# Patient Record
Sex: Female | Born: 1975 | Hispanic: Yes | Marital: Single | Smoking: Never smoker
Health system: Southern US, Community
[De-identification: ages and names within clinical notes are randomized; demographics above are authoritative.]

## PROBLEM LIST (undated history)

## (undated) HISTORY — PX: ANKLE FRACTURE SURGERY: SHX122

---

## 2003-11-25 ENCOUNTER — Inpatient Hospital Stay (HOSPITAL_COMMUNITY): Admission: AD | Admit: 2003-11-25 | Discharge: 2003-11-25 | Payer: Self-pay | Admitting: Obstetrics and Gynecology

## 2004-01-12 ENCOUNTER — Other Ambulatory Visit: Admission: RE | Admit: 2004-01-12 | Discharge: 2004-01-12 | Payer: Self-pay | Admitting: Family Medicine

## 2004-01-12 ENCOUNTER — Encounter: Admission: RE | Admit: 2004-01-12 | Discharge: 2004-01-12 | Payer: Self-pay | Admitting: Family Medicine

## 2004-01-12 ENCOUNTER — Encounter (INDEPENDENT_AMBULATORY_CARE_PROVIDER_SITE_OTHER): Payer: Self-pay | Admitting: Specialist

## 2004-01-26 ENCOUNTER — Encounter: Admission: RE | Admit: 2004-01-26 | Discharge: 2004-01-26 | Payer: Self-pay | Admitting: Obstetrics and Gynecology

## 2004-07-26 ENCOUNTER — Ambulatory Visit: Payer: Self-pay | Admitting: Family Medicine

## 2005-07-02 ENCOUNTER — Ambulatory Visit: Payer: Self-pay | Admitting: Obstetrics & Gynecology

## 2005-07-02 ENCOUNTER — Encounter (INDEPENDENT_AMBULATORY_CARE_PROVIDER_SITE_OTHER): Payer: Self-pay | Admitting: *Deleted

## 2005-12-31 ENCOUNTER — Emergency Department (HOSPITAL_COMMUNITY): Admission: EM | Admit: 2005-12-31 | Discharge: 2005-12-31 | Payer: Self-pay | Admitting: Emergency Medicine

## 2007-02-09 ENCOUNTER — Inpatient Hospital Stay (HOSPITAL_COMMUNITY): Admission: AD | Admit: 2007-02-09 | Discharge: 2007-02-09 | Payer: Self-pay | Admitting: Obstetrics & Gynecology

## 2007-09-20 ENCOUNTER — Inpatient Hospital Stay (HOSPITAL_COMMUNITY): Admission: AD | Admit: 2007-09-20 | Discharge: 2007-09-21 | Payer: Self-pay | Admitting: Obstetrics

## 2008-01-04 ENCOUNTER — Ambulatory Visit: Payer: Self-pay | Admitting: Family Medicine

## 2008-03-02 ENCOUNTER — Ambulatory Visit: Payer: Self-pay | Admitting: Obstetrics & Gynecology

## 2008-03-07 ENCOUNTER — Encounter: Payer: Self-pay | Admitting: Internal Medicine

## 2008-03-07 ENCOUNTER — Ambulatory Visit: Payer: Self-pay | Admitting: Internal Medicine

## 2008-03-29 ENCOUNTER — Ambulatory Visit: Payer: Self-pay | Admitting: Internal Medicine

## 2009-01-24 ENCOUNTER — Ambulatory Visit: Payer: Self-pay | Admitting: Family Medicine

## 2009-04-25 ENCOUNTER — Encounter (INDEPENDENT_AMBULATORY_CARE_PROVIDER_SITE_OTHER): Payer: Self-pay | Admitting: Adult Health

## 2009-04-25 ENCOUNTER — Encounter: Payer: Self-pay | Admitting: Internal Medicine

## 2009-04-25 ENCOUNTER — Ambulatory Visit: Payer: Self-pay | Admitting: Internal Medicine

## 2009-04-25 LAB — CONVERTED CEMR LAB
ALT: 10 units/L (ref 0–35)
Basophils Absolute: 0 10*3/uL (ref 0.0–0.1)
Chlamydia, DNA Probe: NEGATIVE
Creatinine, Ser: 0.64 mg/dL (ref 0.40–1.20)
Eosinophils Absolute: 1.1 10*3/uL — ABNORMAL HIGH (ref 0.0–0.7)
Eosinophils Relative: 10 % — ABNORMAL HIGH (ref 0–5)
Hemoglobin: 12.9 g/dL (ref 12.0–15.0)
MCV: 84.4 fL (ref 78.0–100.0)
Monocytes Relative: 6 % (ref 3–12)
Platelets: 280 10*3/uL (ref 150–400)
Sodium: 138 meq/L (ref 135–145)
Total Bilirubin: 0.5 mg/dL (ref 0.3–1.2)

## 2009-05-01 ENCOUNTER — Ambulatory Visit: Payer: Self-pay | Admitting: *Deleted

## 2009-05-25 ENCOUNTER — Ambulatory Visit: Payer: Self-pay | Admitting: Internal Medicine

## 2009-09-07 ENCOUNTER — Ambulatory Visit (HOSPITAL_COMMUNITY): Admission: RE | Admit: 2009-09-07 | Discharge: 2009-09-07 | Payer: Self-pay | Admitting: Obstetrics & Gynecology

## 2010-01-09 ENCOUNTER — Ambulatory Visit: Payer: Self-pay | Admitting: Family

## 2010-01-09 ENCOUNTER — Inpatient Hospital Stay (HOSPITAL_COMMUNITY): Admission: AD | Admit: 2010-01-09 | Discharge: 2010-01-10 | Payer: Self-pay | Admitting: Obstetrics and Gynecology

## 2010-11-23 ENCOUNTER — Ambulatory Visit (INDEPENDENT_AMBULATORY_CARE_PROVIDER_SITE_OTHER): Payer: No Typology Code available for payment source

## 2010-11-23 ENCOUNTER — Observation Stay (HOSPITAL_COMMUNITY)
Admission: EM | Admit: 2010-11-23 | Discharge: 2010-11-24 | Disposition: A | Discharge status: Home / Self Care | Payer: No Typology Code available for payment source | Attending: Orthopaedic Surgery | Admitting: Orthopaedic Surgery

## 2010-11-23 ENCOUNTER — Inpatient Hospital Stay (INDEPENDENT_AMBULATORY_CARE_PROVIDER_SITE_OTHER)
Admission: RE | Admit: 2010-11-23 | Discharge: 2010-11-23 | Disposition: A | Discharge status: Home / Self Care | Payer: No Typology Code available for payment source | Source: Ambulatory Visit | Attending: Emergency Medicine | Admitting: Emergency Medicine

## 2010-11-23 DIAGNOSIS — S82899A Other fracture of unspecified lower leg, initial encounter for closed fracture: Principal | ICD-10-CM | POA: Insufficient documentation

## 2010-11-23 DIAGNOSIS — S82409A Unspecified fracture of shaft of unspecified fibula, initial encounter for closed fracture: Secondary | ICD-10-CM

## 2010-11-23 DIAGNOSIS — W19XXXA Unspecified fall, initial encounter: Secondary | ICD-10-CM | POA: Insufficient documentation

## 2010-11-23 LAB — DIFFERENTIAL
Eosinophils Absolute: 0.9 10*3/uL — ABNORMAL HIGH (ref 0.0–0.7)
Lymphocytes Relative: 31 % (ref 12–46)
Monocytes Absolute: 0.8 10*3/uL (ref 0.1–1.0)
Neutrophils Relative %: 52 % (ref 43–77)

## 2010-11-23 LAB — BASIC METABOLIC PANEL
BUN: 8 mg/dL (ref 6–23)
Calcium: 8.8 mg/dL (ref 8.4–10.5)
Creatinine, Ser: 0.58 mg/dL (ref 0.4–1.2)
Potassium: 3.9 mEq/L (ref 3.5–5.1)
Sodium: 138 mEq/L (ref 135–145)

## 2010-11-23 LAB — CBC
HCT: 36.4 % (ref 36.0–46.0)
Hemoglobin: 12.3 g/dL (ref 12.0–15.0)
MCHC: 33.8 g/dL (ref 30.0–36.0)
MCV: 82 fL (ref 78.0–100.0)
RDW: 13.1 % (ref 11.5–15.5)

## 2010-11-24 LAB — URINALYSIS, ROUTINE W REFLEX MICROSCOPIC
Glucose, UA: NEGATIVE mg/dL
Nitrite: NEGATIVE
Specific Gravity, Urine: 1.01 (ref 1.005–1.030)
pH: 7 (ref 5.0–8.0)

## 2010-11-24 LAB — URINE MICROSCOPIC-ADD ON

## 2010-11-29 NOTE — Op Note (Signed)
  NAME:  Maria Welch, Maria Welch   ACCOUNT NO.:  [id number]  MEDICAL RECORD NO.:  [id number]           PATIENT TYPE:  LOCATION:                                 FACILITY:  PHYSICIAN:  Takako Minckler C. Ophelia Charter, M.D.    DATE OF BIRTH:  1976-08-13  DATE OF PROCEDURE: DATE OF DISCHARGE:                              OPERATIVE REPORT   PREOPERATIVE DIAGNOSIS:  Left closed lateral malleolar fracture with ankle displacement.  POSTOPERATIVE DIAGNOSIS:  Left closed lateral malleolar fracture with ankle displacement.  PROCEDURE:  ORIF left lateral malleolus.  SURGEON:  Dale Ribeiro C. Ophelia Charter, M.D.  ANESTHESIA:  General plus 10 mL Marcaine local.  TOURNIQUET TIME:  19 minutes.  COMPONENTS:  Synthes nonlocking one-third tubular plate with the inner frag screw.  COMPLICATIONS:  None.  PROCEDURE:  After induction of general anesthesia, informed consent, DuraPrep, preoperative Ancef prophylaxis, prepping and draping with DuraPrep with the proximal thigh tourniquet, usual extremity sheets, drapes, stockinette was performed.  Time-out procedure completed.  A leg wrapped with an Esmarch tourniquet inflated.  Lateral skin marker was used.  Incision was made laterally.  Subperiosteal dissection over the fibula, fracture was distracted and irrigated.  Clot was removed, reduced, and held with Verbrugge clamp.  A 6-0 one-third tubular plate was selected, distal screws were cancellous unicortical 12 mm.  Second from the bottom screw did not bite well and at the end of the case it was spinning, did not give good purchase and was in the position where the inner frag screw lagged from anterior proximal to distal posterior. Proximally, three screws were bicortical pin of 12 mm lengths.  Lag screw was inserted.  The fracture was tightened down second screw from the bottom of the plate, which is at the fracture site was not tight, it was removed.  Trial with cancellus was attempted but it did not bite either, so the  screw hole was left open.  If it was made be, bicortical would have been into the syndesmotic joint.  After irrigation saline solution, tourniquet deflated.  Spot fluoro pictures were taken showing anatomic position.  A 2-0 Vicryl and subcutaneous tissue, skin staple closure, postop dressing, short-leg splint with Xeroform, 4x4s, Webril, 5 x 30 sponge x3, more Webril, and 6-inch Ace wrap.  The patient was extubated, transferred to recovery room.     Cass Edinger C. Ophelia Charter, M.D.   ______________________________ Veverly Fells. Ophelia Charter, M.D.    MCY/MEDQ  D:  11/23/2010  T:  11/24/2010  Job:  [id number]  Electronically Signed by Annell Greening M.D. on 11/27/2010 09:03:19 PM

## 2010-12-11 LAB — CBC
HCT: 40.3 % (ref 36.0–46.0)
Hemoglobin: 13.3 g/dL (ref 12.0–15.0)
MCHC: 33 g/dL (ref 30.0–36.0)
MCV: 88.1 fL (ref 78.0–100.0)
Platelets: 181 10*3/uL (ref 150–400)
RBC: 4.57 MIL/uL (ref 3.87–5.11)
RDW: 13.7 % (ref 11.5–15.5)

## 2010-12-11 LAB — RPR: RPR Ser Ql: NONREACTIVE

## 2011-02-08 NOTE — Group Therapy Note (Signed)
NAME:  Maria Welch, Maria Welch    ACCOUNT NO.:  [id number]   MEDICAL RECORD NO.:  [id number]          PATIENT TYPE:  WOC   LOCATION:  [facility]                   FACILITY:  [facility]   PHYSICIAN:  Reda Gettis                DATE OF BIRTH:  [birth year]   DATE OF SERVICE:  07/02/2005                                    CLINIC NOTE   CHIEF COMPLAINT:  Yearly check-up and IUD string check.   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  Patient came today  for one-year repeat Pap smear.  Patient is asymptomatic.   PAST MEDICAL HISTORY:  Patient is healthy otherwise.   OBJECTIVE:  VITAL SIGNS:  Reviewed, within normal limits.  GENERAL:  No acute distress.  CARDIOVASCULAR:  RRR.  No murmurs, rubs, or gallops.  CHEST:  Clear to auscultation bilaterally.  ABDOMEN:  Nontender, nondistended, within normal limits.  BREASTS:  Within normal limits.  GENITOURINARY:  Speculum examination:  There is a bloody discharge in the  cervix.  __________  visualized.  __________  cervix __________  no motion  tenderness, a small uterus, no adnexal masses.   Pap smear, GC/Chlamydia samples taken.   ASSESSMENT/PLAN:  A 35 year old healthy female.  Patient to return to clinic  one year or sooner if abnormal Pap smear.  Patient will be notified.     ______________________________  Mckinnley Cottier    ______________________________  [id number]    Hadley Pen  D:  07/02/2005  T:  07/03/2005  Job:  [id number]

## 2011-06-28 LAB — CBC
MCHC: 33.6
MCV: 78
Platelets: 201
RDW: 13.8
RDW: 14.2
WBC: 11.7 — ABNORMAL HIGH
WBC: 14.6 — ABNORMAL HIGH

## 2011-12-23 ENCOUNTER — Ambulatory Visit: Payer: Self-pay | Admitting: Internal Medicine

## 2011-12-23 VITALS — BP 127/85 | HR 102 | Temp 99.9°F | Resp 16 | Ht 67.75 in | Wt 143.0 lb

## 2011-12-23 DIAGNOSIS — J020 Streptococcal pharyngitis: Secondary | ICD-10-CM

## 2011-12-23 DIAGNOSIS — J039 Acute tonsillitis, unspecified: Secondary | ICD-10-CM

## 2011-12-23 LAB — POCT RAPID STREP A (OFFICE): Rapid Strep A Screen: POSITIVE — AB

## 2011-12-23 MED ORDER — AMOXICILLIN 875 MG PO TABS: 875.0000 mg | ORAL_TABLET | Freq: Two times a day (BID) | ORAL | Status: AC

## 2011-12-23 MED ORDER — MAGIC MOUTHWASH W/LIDOCAINE: 5.0000 mL | Freq: Four times a day (QID) | ORAL | Status: AC | PRN

## 2011-12-23 NOTE — Progress Notes (Signed)
  Subjective:    Patient ID: Maria Welch, female    DOB: June 20, 1976, 36 y.o.   MRN: [id number]  Sore Throat  This is a new problem. The current episode started in the past 7 days. The problem has been gradually worsening. The maximum temperature recorded prior to her arrival was 100 - 100.9 F. The pain is moderate. Associated symptoms include congestion, swollen glands and trouble swallowing. Pertinent negatives include no coughing or headaches.  Rudell Cobb is a 36 year old Spanish speaking lady here with her 59 year old daughter who translates for her.  She works in a [employer] and her boss has been ill recently.  She complains of fever, chills, myalgias, nausea and sore throat.  She denies pregnancy, vomiting, rashes, no cough or chest pain.    Review of Systems  HENT: Positive for congestion and trouble swallowing.   Respiratory: Negative for cough.   Gastrointestinal: Positive for nausea.  Skin: Negative.   Neurological: Negative for headaches.  All other systems reviewed and are negative.       Objective:   Physical Exam  Vitals reviewed. Constitutional: She is oriented to person, place, and time. She appears well-nourished.       Pt appears mildly distressed.  HENT:  Head: Normocephalic.       Tm's injected bilaterally. Large cryptic tonsils with white exudate bilaterally Tender left upper cervical nodes.  Eyes: Conjunctivae are normal.  Neck: Neck supple.  Cardiovascular: Regular rhythm and normal heart sounds.        Pulse 104  Pulmonary/Chest: Effort normal and breath sounds normal.  Abdominal: Soft.  Musculoskeletal: Normal range of motion.  Lymphadenopathy:    She has cervical adenopathy.  Neurological: She is alert and oriented to person, place, and time.  Skin: Skin is warm and dry.  Psychiatric: Her behavior is normal.          Assessment & Plan:  RS positive.  Amoxicillin 875 BID for 10 days.  Magic Mouthwash and Ibuprophen 800 mg every 8  hours for pain for a few days with food.  AVS printed and given in Spanish.  OOW until Wed April 3rd note written.

## 2011-12-23 NOTE — Patient Instructions (Signed)
Angina por estreptococos (Strep Throat)  La angina estreptocccica es una infeccin en la garganta causada por una bacteria llamada Streptococcus pyogenes. El mdico la llamar "amigdalitis" o "faringitis" estreptocccica, segn haya signos de inflamacin en las amgdalas o en la zona posterior de la garganta. Es ms frecuente en nios entre los 5 y los 15 aos durante los meses de fro, Biomedical engineer puede ocurrir a [employer]er [employer]. La infeccin se transmite de persona a persona (contagio) a travs de la tos, el estornudo u otro contacto cercano.  SNTOMAS  Fiebre o escalofros.   La garganta o las [employer] duelen y estn inflamadas.   Dolor o dificultad para tragar.   Puntos blancos o amarillos en las amgdalas o la garganta.   Ganglios linfticos hinchados a los lados del cuello o debajo de la [place].   Erupcin rojiza en todo el cuerpo (poco comn).  DIAGNSTICO Diferentes infecciones pueden causar los mismos sntomas. Para confirmar el diagnstico deber [employer], y as podrn prescribirle el tratamiento adecuado. La "prueba rpida de estreptococo" ayudar al mdico a hacer el diagnstico en algunos minutos. Si no se dispone de la prueba, se har un rpido hisopado de la zona afectada para hacer un cultivo de las secreciones de la garganta. Si se hace un cultivo, los resultados estarn disponibles en [employer].  TRATAMIENTO El estreptococo de garganta se trata con antibiticos. INSTRUCCIONES PARA EL CUIDADO DOMICILIARIO  Haga grgaras con 1 cucharadita de sal en 1 taza de agua tibia, 3  4 veces por da o cuando lo crea necesario para sentirse mejor.   Los miembros de la familia que tambin tengan dolor en la garganta deben ser evaluados y tratados con antibiticos si tienen la infeccin.   Asegrese de que todas las personas de su casa se laven [employer].   No comparta alimentos, tazas ni utensilios personales que puedan contagiar la infeccin.   Coma  alimentos blandos hasta que el dolor de garganta mejore.   Beba gran cantidad de lquido para mantener la orina de tono claro o color amarillo plido. Esto ayudar a Agricultural engineer.   Debe hacer reposo.   No concurra a la escuela o la trabajo hasta que haya tomado los antibiticos durante 24 horas.   Utilice los medicamentos de venta libre o de prescripcin para Chief Technology Officer, Environmental health practitioner o la [place], segn se lo indique el profesional que lo asiste.   Si le han recetado medicamentos, tmelos segn las indicaciones. Finalice la prescripcin completa, aunque se sienta mejor.  SOLICITE ATENCIN MDICA SI:  Los ganglios del cuello siguen agrandados.   Aparece una erupcin cutnea, tos o dolor de odos.   Tiene un catarro verde, amarillo amarronado o esputo sanguinolento.   Siente dolor o Dentist que no puede controlar con los medicamentos.   Los sntomas parecen empeorar en vez de mejorar.  SOLICITE ATENCIN MDICA DE INMEDIATO SI:  Presenta algn nuevo sntoma, como vmitos, dolor de cabeza intenso, rigidez o dolor en el cuello, dolor en el pecho o problemas respiratorios o dificultad para tragar.   Presenta dolor de garganta intenso, babeo o cambios en la voz.   Siente que el cuello se hincha o la piel de esa zona se vuelve roja y sensible.   Tiene fiebre.   Tiene signos de deshidratacin, como fatiga, boca seca y disminucin de la [place].   Comienza a sentir mucho sueo, o no puede despertarse bien.  Document Released: 06/19/2005 Document Revised: 08/29/2011 [employer]  Patient Information 2012 [employer]. 

## 2012-02-26 IMAGING — CR DG ANKLE COMPLETE 3+V*L*
3 series · 3 of 3 positions shown · non-contrast
Comparison: None

CLINICAL DATA: Fall, ankle injury.

LEFT ANKLE COMPLETE - 3+ VIEW

[view not recorded (1 of 3)]
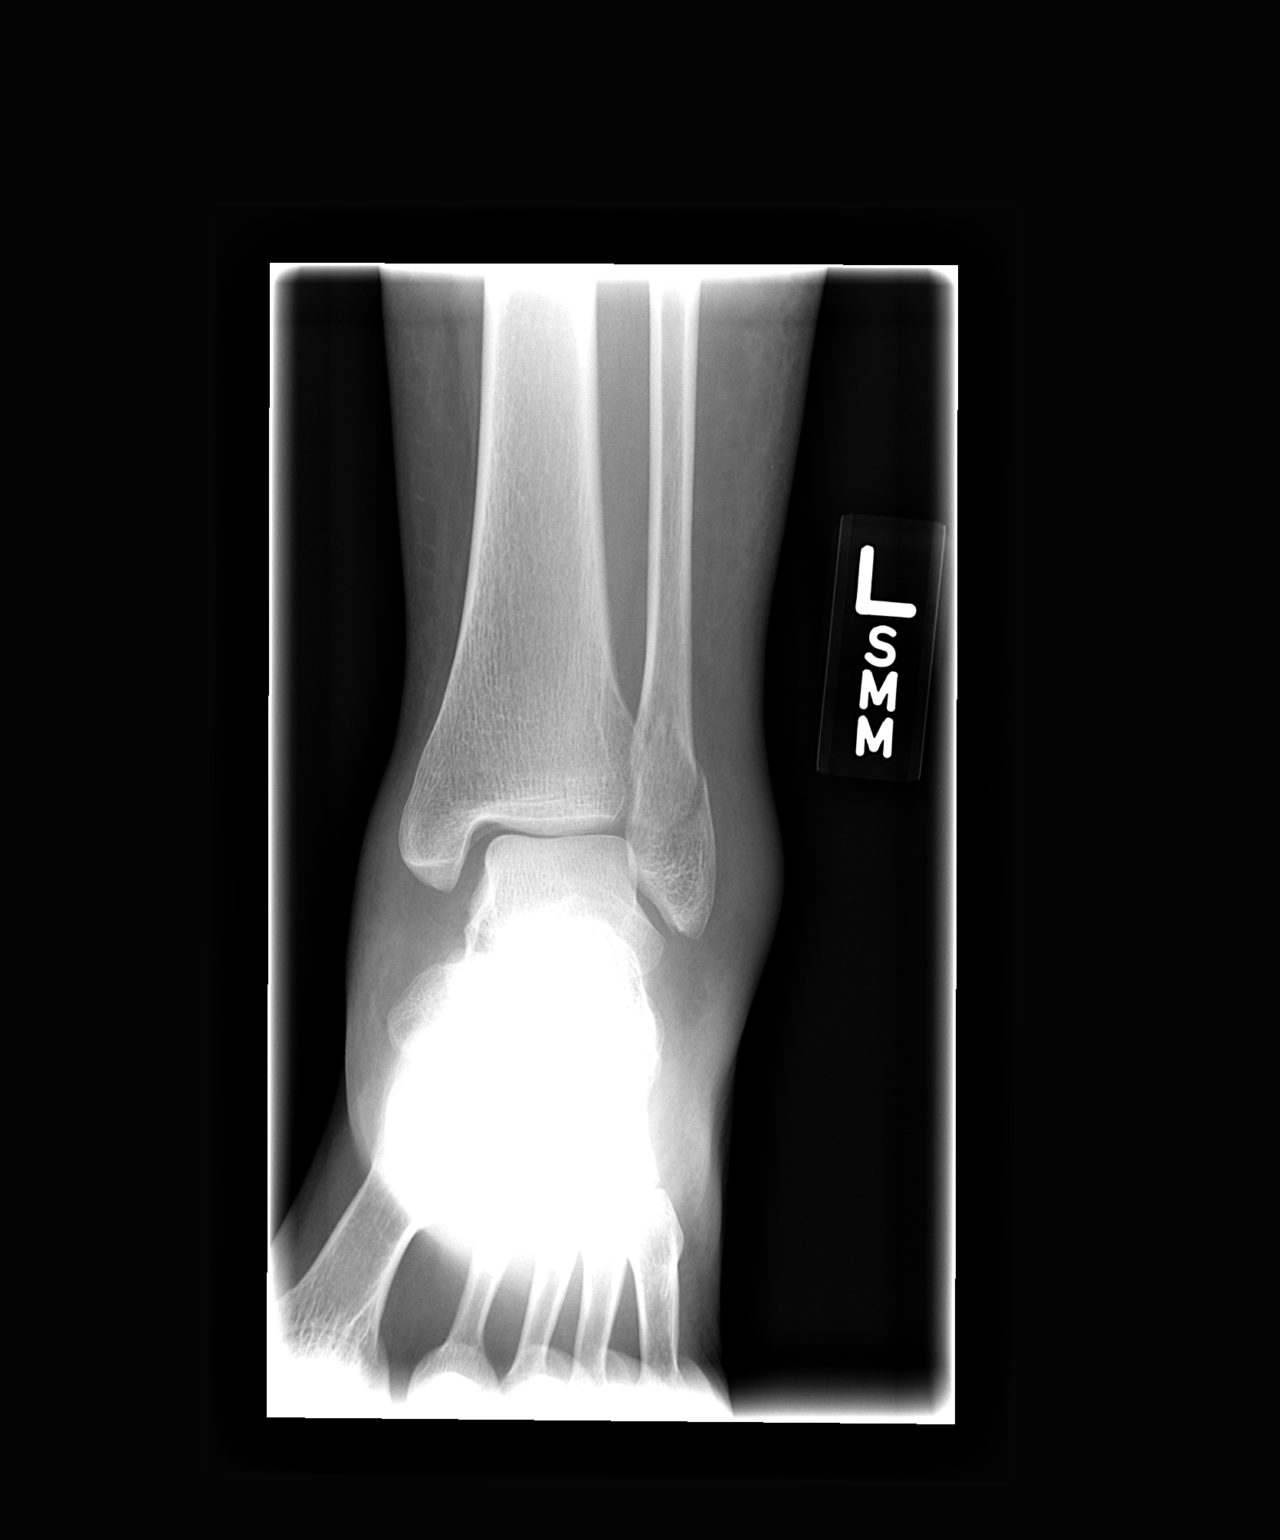

[view not recorded (2 of 3)]
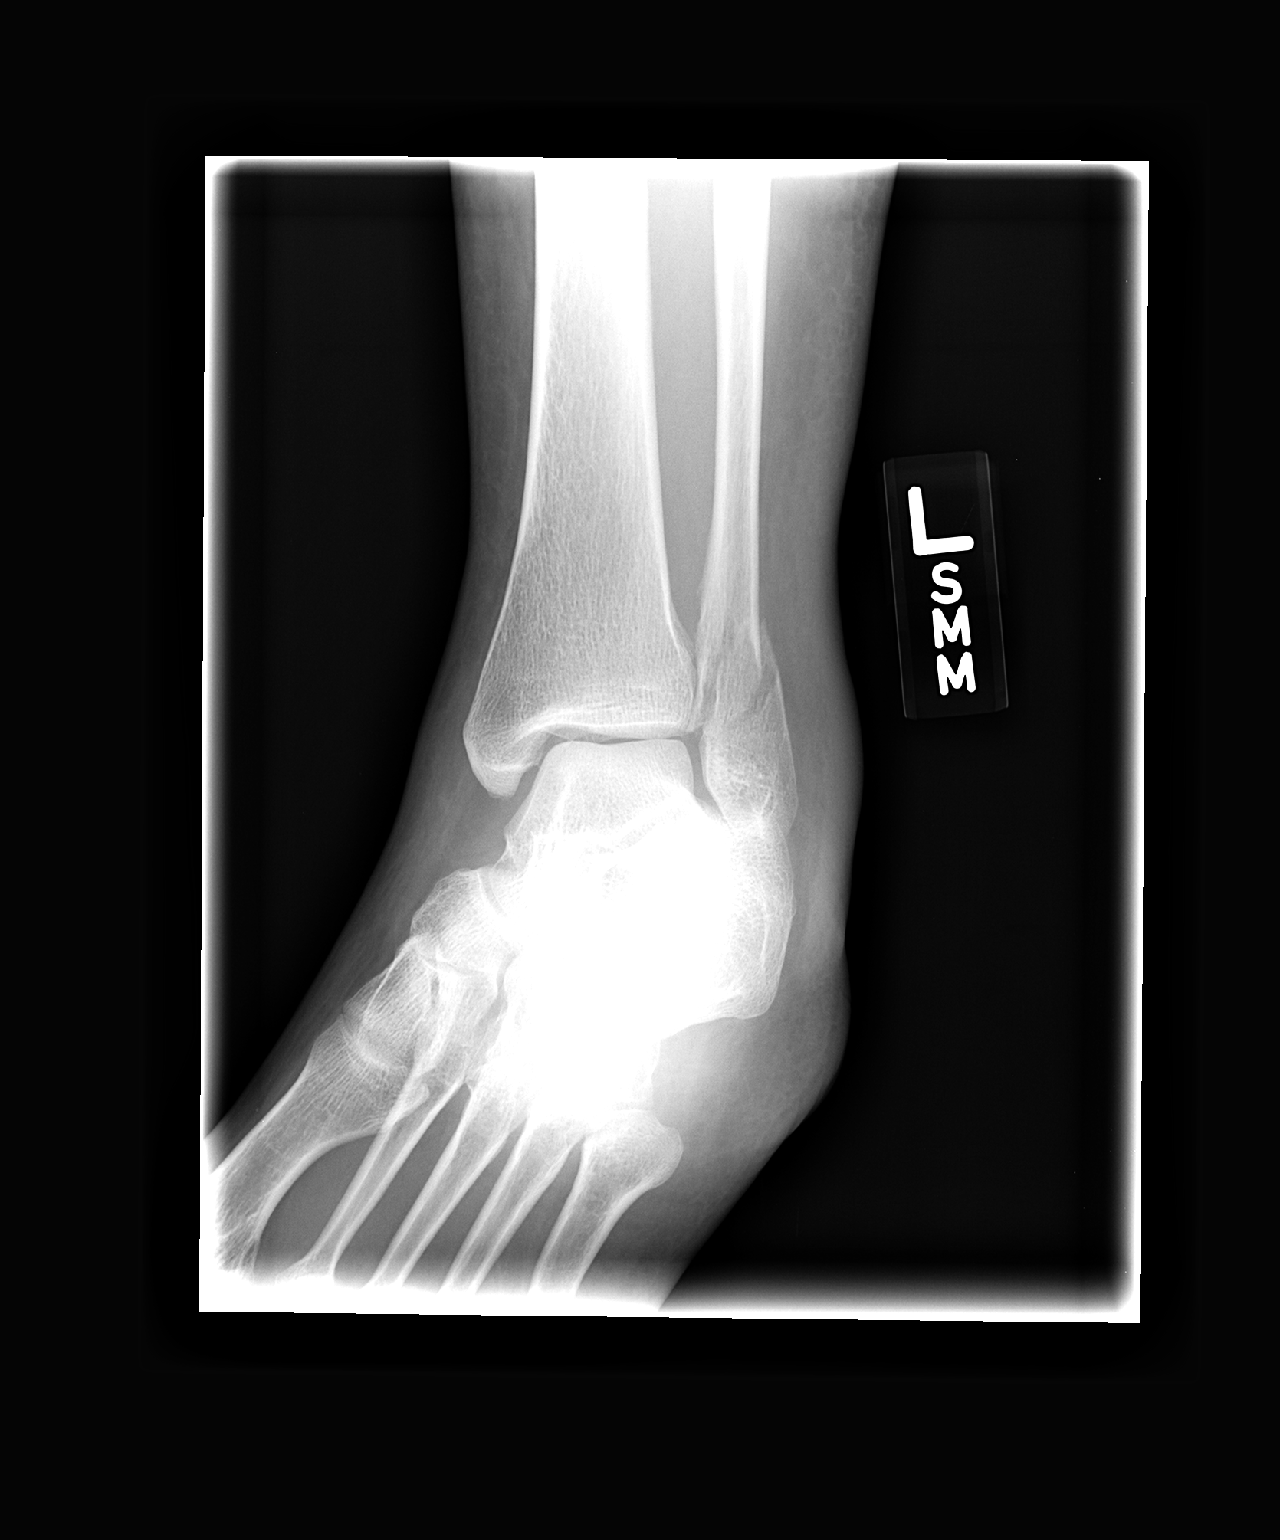

[view not recorded (3 of 3)]
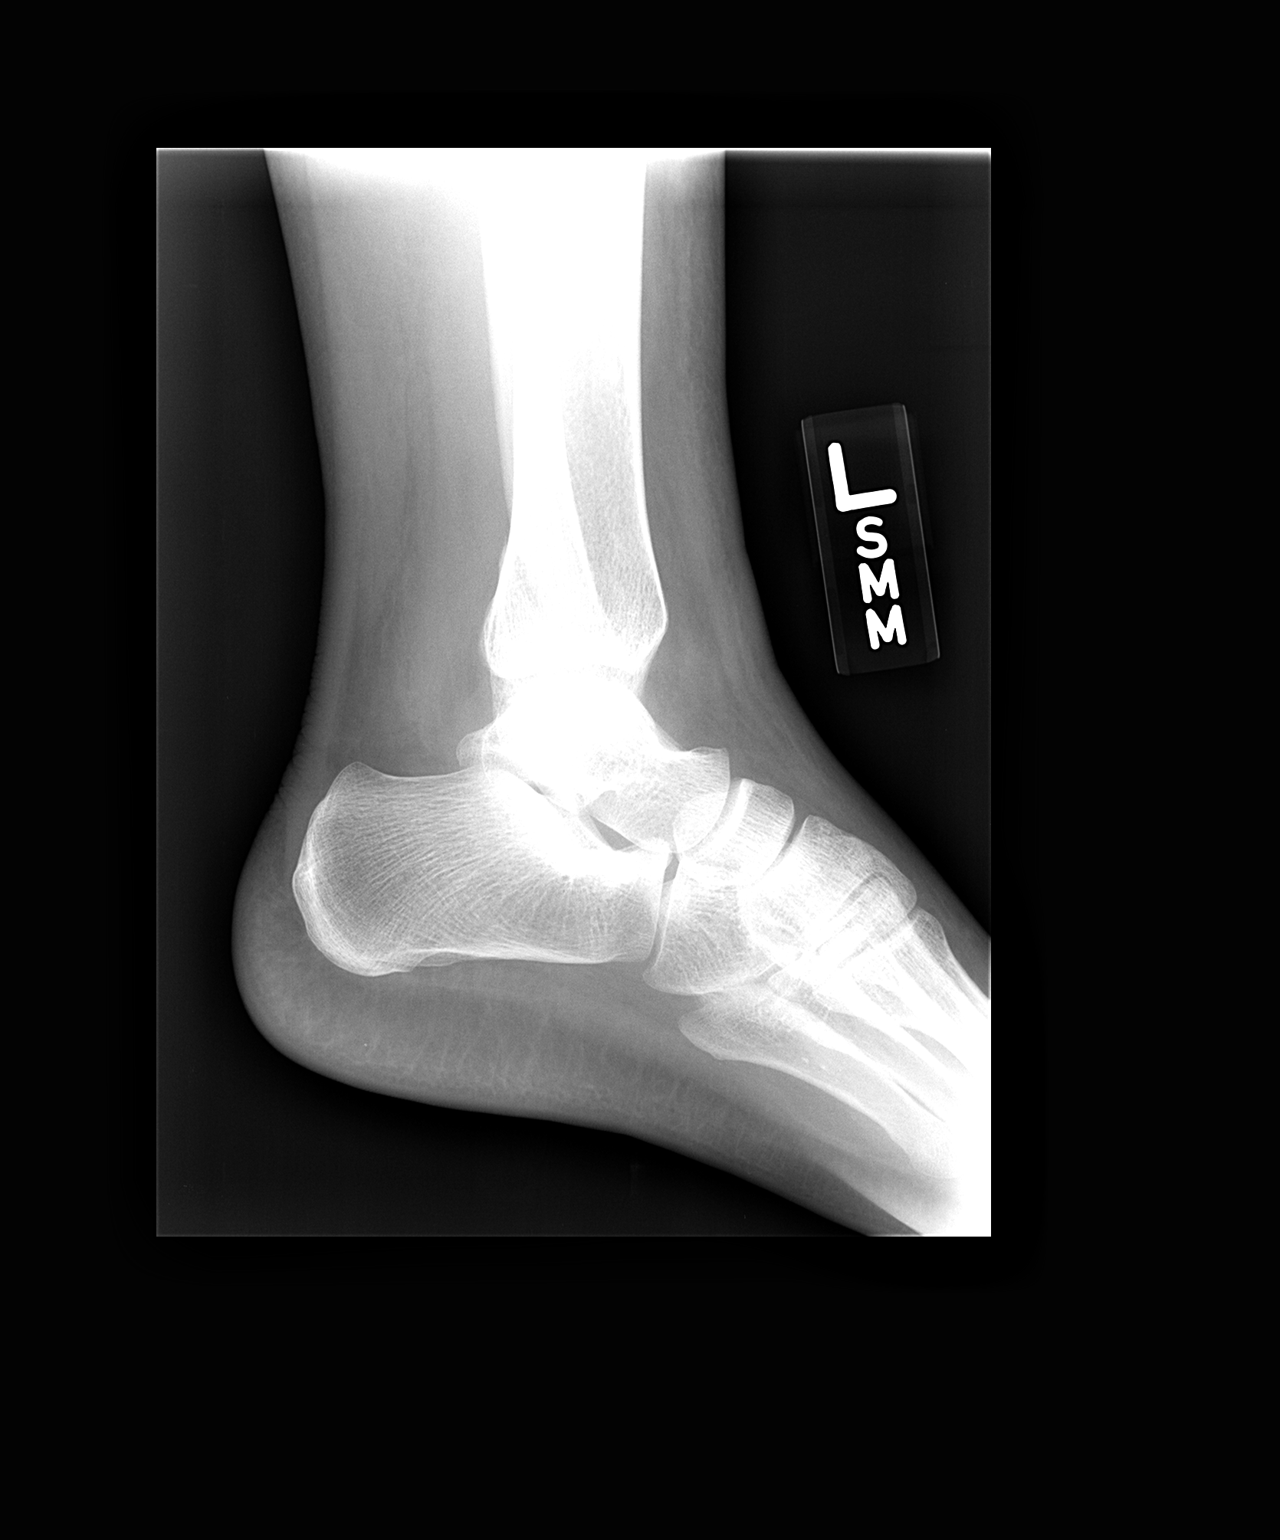

[3 of 3 positions shown; findings below may reference images not displayed]

FINDINGS: There is an oblique fracture through the distal left
fibula.  No tibial abnormality visualized.  Overlying lateral soft
tissue swelling.
IMPRESSION: Oblique fracture through the distal left fibula.

## 2012-10-22 ENCOUNTER — Emergency Department (HOSPITAL_COMMUNITY)
Admission: EM | Admit: 2012-10-22 | Discharge: 2012-10-22 | Disposition: A | Discharge status: Home / Self Care | Payer: No Typology Code available for payment source | Source: Home / Self Care

## 2012-10-22 MED ORDER — INFLUENZA VIRUS VACC SPLIT PF IM SUSP
0.5000 mL | Freq: Once | INTRAMUSCULAR | Status: AC
  Administered 2012-10-22: 0.5 mL via INTRAMUSCULAR

## 2012-10-22 NOTE — ED Notes (Signed)
Per Dr. Cranford Mon to give flu vaccine.

## 2012-10-29 ENCOUNTER — Emergency Department (HOSPITAL_COMMUNITY)
Admission: EM | Admit: 2012-10-29 | Discharge: 2012-10-29 | Disposition: A | Discharge status: Home / Self Care | Payer: No Typology Code available for payment source | Source: Home / Self Care

## 2012-10-29 ENCOUNTER — Encounter (HOSPITAL_COMMUNITY): Payer: Self-pay

## 2012-10-29 DIAGNOSIS — K029 Dental caries, unspecified: Secondary | ICD-10-CM

## 2012-10-29 NOTE — ED Provider Notes (Signed)
History     CSN: [id number]  Arrival date & time 10/29/12  1644   None     No chief complaint on file.   (Consider location/radiation/quality/duration/timing/severity/associated sxs/prior treatment) HPI Pt presenting today for dental referral.  Pt says that she has been in need for a dental exam and treatment.  No other complaints.   History reviewed. No pertinent past medical history.  History reviewed. No pertinent past surgical history.  No family history on file.  History  Substance Use Topics  . Smoking status: Never Smoker   . Smokeless tobacco: Not on file  . Alcohol Use: Not on file    OB History    Grav Para Term Preterm Abortions TAB SAB Ect Mult Living                  Review of Systems  Constitutional: Negative.   HENT: Negative.   Respiratory: Negative.   Cardiovascular: Negative.   Musculoskeletal: Negative.   Neurological: Negative.   Hematological: Negative.   Psychiatric/Behavioral: Negative.     Allergies  Review of patient's allergies indicates no known allergies.  Home Medications   Current Outpatient Rx  Name  Route  Sig  Dispense  Refill  . MAGIC MOUTHWASH W/LIDOCAINE   Oral   Take 5 mLs by mouth 4 (four) times daily as needed.   120 mL   0     Temp 97.7 F (36.5 C) (Oral)  Physical Exam  Nursing note and vitals reviewed. Constitutional: She is oriented to person, place, and time. She appears well-developed and well-nourished. No distress.  HENT:  Head: Normocephalic and atraumatic.  Right Ear: External ear normal.  Left Ear: External ear normal.       gingivitis  Eyes: EOM are normal. Pupils are equal, round, and reactive to light.  Neck: Normal range of motion.  Cardiovascular: Normal rate, regular rhythm and normal heart sounds.   Pulmonary/Chest: Effort normal.  Abdominal: Soft. Bowel sounds are normal.  Musculoskeletal: Normal range of motion.  Neurological: She is alert and oriented to person, place, and time.   Skin: Skin is warm and dry.  Psychiatric: She has a normal mood and affect. Her behavior is normal. Judgment and thought content normal.    ED Course  Procedures (including critical care time)  Labs Reviewed - No data to display No results found.   No diagnosis found.    MDM  IMPRESSION  gingivitis  RECOMMENDATIONS / PLAN Dental referral     FOLLOW UP As needed for regular medical care   The patient was given clear instructions to go to ER or return to medical center if symptoms don't improve, worsen or new problems develop.  The patient verbalized understanding.  The patient was told to call to get lab results if they haven't heard anything in the next week.            Cleora Fleet, MD 10/30/12 [phone]

## 2012-10-29 NOTE — ED Notes (Signed)
Here for referral to dentist

## 2015-04-19 ENCOUNTER — Ambulatory Visit (INDEPENDENT_AMBULATORY_CARE_PROVIDER_SITE_OTHER): Payer: Self-pay | Admitting: Family Medicine

## 2015-04-19 VITALS — BP 104/72 | HR 76 | Temp 98.6°F | Resp 18 | Ht 62.0 in | Wt 162.0 lb

## 2015-04-19 DIAGNOSIS — R1032 Left lower quadrant pain: Secondary | ICD-10-CM

## 2015-04-19 DIAGNOSIS — M545 Low back pain, unspecified: Secondary | ICD-10-CM

## 2015-04-19 DIAGNOSIS — Z124 Encounter for screening for malignant neoplasm of cervix: Secondary | ICD-10-CM

## 2015-04-19 DIAGNOSIS — B3731 Acute candidiasis of vulva and vagina: Secondary | ICD-10-CM

## 2015-04-19 DIAGNOSIS — B373 Candidiasis of vulva and vagina: Secondary | ICD-10-CM

## 2015-04-19 DIAGNOSIS — R1031 Right lower quadrant pain: Secondary | ICD-10-CM

## 2015-04-19 DIAGNOSIS — Z789 Other specified health status: Secondary | ICD-10-CM

## 2015-04-19 LAB — POCT URINE PREGNANCY: PREG TEST UR: NEGATIVE

## 2015-04-19 LAB — POCT URINALYSIS DIPSTICK
Bilirubin, UA: NEGATIVE
Blood, UA: NEGATIVE
GLUCOSE UA: NEGATIVE
NITRITE UA: NEGATIVE
PH UA: 7
PROTEIN UA: NEGATIVE
Spec Grav, UA: 1.02
UROBILINOGEN UA: 1

## 2015-04-19 LAB — POCT CBC
Granulocyte percent: 62.6 %G (ref 37–80)
HEMATOCRIT: 38.3 % (ref 37.7–47.9)
Hemoglobin: 12.7 g/dL (ref 12.2–16.2)
LYMPH, POC: 3 (ref 0.6–3.4)
MCH: 27.3 pg (ref 27–31.2)
MCHC: 33.2 g/dL (ref 31.8–35.4)
MCV: 82.4 fL (ref 80–97)
MID (CBC): 0.9 (ref 0–0.9)
MPV: 7.4 fL (ref 0–99.8)
PLATELET COUNT, POC: 305 10*3/uL (ref 142–424)
POC GRANULOCYTE: 6.5 (ref 2–6.9)
POC LYMPH PERCENT: 28.7 %L (ref 10–50)
POC MID %: 8.7 %M (ref 0–12)
RBC: 4.65 M/uL (ref 4.04–5.48)
RDW, POC: 13.9 %
WBC: 10.4 10*3/uL — AB (ref 4.6–10.2)

## 2015-04-19 MED ORDER — CYCLOBENZAPRINE HCL 10 MG PO TABS: 10.0000 mg | ORAL_TABLET | Freq: Two times a day (BID) | ORAL | Status: AC | PRN

## 2015-04-19 NOTE — Progress Notes (Addendum)
[facility] [address] [place] [address] [phone]  Date:  04/19/2015   Name:  Maria Welch   DOB:  [birth year]   MRN:  [id number]  PCP:  No primary care provider on file.    Chief Complaint: pain in lower back; pain in stomach; pain in left leg; and female wellness check   History of Present Illness:  Maria Welch is a 39 y.o. very pleasant female patient who presents with the following:  Last visit here over 3 years ago so she is seen as a new patient today.   She has noted abd pain for approx one month.  She notes that when she lifting something heavy or bearing down, or when using her stomach muscles (such as when sitting up in bed) she has pain in her lower abd.  It is often all across her lower belly, or may be one side of the other.  It is not there every day.  She got more concerned when she had a more severe pain in the RLQ yesterday evening, it lasted about 20 minutes.  This prompted her to come in today.   She will notice pain when she needs to have a BM.  However her stools are normal, she is not constipated.  She does feel better after having a BM She is eating normally, no weight change noted   She had her paraguard placed 2011- she has never had an issues with it.  She does have menses and her LMP was the first of this month No urinary sx.    She also notes that her back "goes to sleep" and hurts intermittently.  This occurs more when she sits down.  She has noted this for 3 months.  She has not tried anything at all for her back so far.    She has 3 children.  Speaks limited English- accompanied today but a good friend of her eldest daughter who helps with interpretation  There are no active problems to display for this patient.   History reviewed. No pertinent past medical history.  Past Surgical History  Procedure Laterality Date  . Ankle fracture surgery Left     History  Substance Use Topics  .  Smoking status: Never Smoker   . Smokeless tobacco: Not on file  . Alcohol Use: Not on file    Family History  Problem Relation Age of Onset  . Diabetes Mother   . Diabetes Father     No Known Allergies  Medication list has been reviewed and updated.  Current Outpatient Prescriptions on File Prior to Visit  Medication Sig Dispense Refill  . Alum & Mag Hydroxide-Simeth (MAGIC MOUTHWASH W/LIDOCAINE) SOLN Take 5 mLs by mouth 4 (four) times daily as needed. (Patient not taking: Reported on 04/19/2015) 120 mL 0   No current facility-administered medications on file prior to visit.    Review of Systems:  As per HPI- otherwise negative.   Physical Examination: Filed Vitals:   04/19/15 1550  BP: 104/72  Pulse: 76  Temp: 98.6 F (37 C)  Resp: 18   Filed Vitals:   04/19/15 1550  Height: 5\' 2"  (1.575 m)  Weight: 162 lb (73.483 kg)   Body mass index is 29.62 kg/(m^2). Ideal Body Weight: Weight in (lb) to have BMI = 25: 136.4  GEN: WDWN, NAD, Non-toxic, A & O x 3, overweight, looks well HEENT: Atraumatic, Normocephalic. Neck supple. No masses, No LAD.  Bilateral TM  wnl, oropharynx normal.  PEERL,EOMI.   Ears and Nose: No external deformity. CV: RRR, No M/G/R. No JVD. No thrill. No extra heart sounds. PULM: CTA B, no wheezes, crackles, rhonchi. No retractions. No resp. distress. No accessory muscle use. ABD: S, ND, +BS. No rebound. No HSM.  She noted mild tenderness across her lower abdomen in all quadrants EXTR: No c/c/e Normal strength and SLR, DTR both LE.  She has mild tenderness and spasm over the bilateral lumbar muscles NEURO Normal gait.  PSYCH: Normally interactive. Conversant. Not depressed or anxious appearing.  Calm demeanor.  Pelvic: normal, no vaginal lesions or discharge. Uterus normal, no CMT, no adnexal tendereness or masses.  Mild tenderness over both adnexa.  White IUD strings are visible at cervical os.     Results for orders placed or performed in visit  on 04/19/15  POCT CBC  Result Value Ref Range   WBC 10.4 (A) 4.6 - 10.2 K/uL   Lymph, poc 3.0 0.6 - 3.4   POC LYMPH PERCENT 28.7 10 - 50 %L   MID (cbc) 0.9 0 - 0.9   POC MID % 8.7 0 - 12 %M   POC Granulocyte 6.5 2 - 6.9   Granulocyte percent 62.6 37 - 80 %G   RBC 4.65 4.04 - 5.48 M/uL   Hemoglobin 12.7 12.2 - 16.2 g/dL   HCT, POC 40.9 81.1 - 47.9 %   MCV 82.4 80 - 97 fL   MCH, POC 27.3 27 - 31.2 pg   MCHC 33.2 31.8 - 35.4 g/dL   RDW, POC 91.4 %   Platelet Count, POC 305 142 - 424 K/uL   MPV 7.4 0 - 99.8 fL  POCT urinalysis dipstick  Result Value Ref Range   Color, UA yellow    Clarity, UA clear    Glucose, UA neg    Bilirubin, UA neg    Ketones, UA trace    Spec Grav, UA 1.020    Blood, UA neg    pH, UA 7.0    Protein, UA neg    Urobilinogen, UA 1.0    Nitrite, UA neg    Leukocytes, UA small (1+) (A) Negative  POCT urine pregnancy  Result Value Ref Range   Preg Test, Ur Negative Negative    Assessment and Plan: Bilateral lower abdominal pain - Plan: POCT CBC, POCT urinalysis dipstick, POCT urine pregnancy, US Pelvis Complete  Bilateral low back pain without sciatica - Plan: cyclobenzaprine (FLEXERIL) 10 MG tablet  Screening for cervical cancer - Plan: Pap IG, CT/NG w/ reflex HPV when ASC-U  Here today with a month of lower abd pain, with a 20 minute episode of RLQ pain last night.  This acute pain is now resolved. Counseled pt that her pain is most likely of benign etiology such as a functional ovarian cyst or gas.  It is unlikely but possible that she could have appendicitis or another serious concern.  Offered to send for CT scan if she would like.  Discussed benefit of possibly discovering severe illness, and drawback of cost and radiation.  At this time she declines a CT, but would like to do an [place] to image her ovaries.  Will arrange this for her but cautioned to seek care right away if her more severe or persistent pain returns.  encouraged her to try ibuprofen for  cramping and pain as needed  Will use flexeril as needed for back pain as this tends to bother her more at night  Made sure that pt understood our thinking and her options as above, invited and answered all questions See patient instructions for more details.    Signed Abbe Amsterdam, MD  7/31:called, she has vaginal yeast on pap.  No answer, no machine.  Will try later Results for orders placed or performed in visit on 04/19/15  POCT CBC  Result Value Ref Range   WBC 10.4 (A) 4.6 - 10.2 K/uL   Lymph, poc 3.0 0.6 - 3.4   POC LYMPH PERCENT 28.7 10 - 50 %L   MID (cbc) 0.9 0 - 0.9   POC MID % 8.7 0 - 12 %M   POC Granulocyte 6.5 2 - 6.9   Granulocyte percent 62.6 37 - 80 %G   RBC 4.65 4.04 - 5.48 M/uL   Hemoglobin 12.7 12.2 - 16.2 g/dL   HCT, POC 16.1 09.6 - 47.9 %   MCV 82.4 80 - 97 fL   MCH, POC 27.3 27 - 31.2 pg   MCHC 33.2 31.8 - 35.4 g/dL   RDW, POC 04.5 %   Platelet Count, POC 305 142 - 424 K/uL   MPV 7.4 0 - 99.8 fL  POCT urinalysis dipstick  Result Value Ref Range   Color, UA yellow    Clarity, UA clear    Glucose, UA neg    Bilirubin, UA neg    Ketones, UA trace    Spec Grav, UA 1.020    Blood, UA neg    pH, UA 7.0    Protein, UA neg    Urobilinogen, UA 1.0    Nitrite, UA neg    Leukocytes, UA small (1+) (A) Negative  POCT urine pregnancy  Result Value Ref Range   Preg Test, Ur Negative Negative  Pap IG, CT/NG w/ reflex HPV when ASC-U  Result Value Ref Range   Specimen adequacy: SEE NOTE    FINAL DIAGNOSIS: SEE NOTE    COMMENTS: SEE NOTE    Cytotechnologist: SEE NOTE    Chlamydia Probe Amp NEGATIVE    GC Probe Amp NEGATIVE      Called and reached her on 8/2- let her know that she does have yeast vaginitis.  She has not heard about her [place]- will ask referrals about this

## 2015-04-19 NOTE — Patient Instructions (Addendum)
I think that you have sore muscles and spasm in your back Use the flexeril as needed for your back pain- however it can make you sleepy so do not use it when you are driving I suspect that your pain last night was due to an ovarian cyst.  We will arrange an ultrasound to look at this in more detail (If your pain is TOTALLY resolved in the next few days you do not have to go for the ultrasound) As we discussed, your pain is not typical of something more serious such as appendicitis.   However if severe pains return or persist please seek care with [place] or the ER right away!   Other concerning signs are lack of appetite, fever, or vomiting   Ibuprofen over the counter is a good option for your lower abdominal cramping.   You can use this as directed on the bottle

## 2015-04-20 ENCOUNTER — Other Ambulatory Visit: Payer: Self-pay

## 2015-04-20 DIAGNOSIS — R1031 Right lower quadrant pain: Secondary | ICD-10-CM

## 2015-04-20 DIAGNOSIS — R1032 Left lower quadrant pain: Principal | ICD-10-CM

## 2015-04-21 LAB — PAP IG, CT-NG, RFX HPV ASCU
Chlamydia Probe Amp: NEGATIVE
GC Probe Amp: NEGATIVE

## 2015-04-25 ENCOUNTER — Encounter: Payer: Self-pay | Admitting: Family Medicine

## 2015-04-25 MED ORDER — FLUCONAZOLE 150 MG PO TABS: 150.0000 mg | ORAL_TABLET | Freq: Once | ORAL | Status: AC

## 2015-04-25 MED ORDER — FLUCONAZOLE 150 MG PO TABS: 150.0000 mg | ORAL_TABLET | Freq: Once | ORAL | Status: DC

## 2015-04-25 NOTE — Addendum Note (Signed)
Addended by: Abbe Amsterdam C on: 04/25/2015 11:41 AM   Modules accepted: Orders

## 2015-04-25 NOTE — Addendum Note (Signed)
Addended by: Abbe Amsterdam C on: 04/25/2015 12:09 PM   Modules accepted: Orders

## 2015-05-03 ENCOUNTER — Encounter: Payer: Self-pay | Admitting: Family Medicine

## 2015-05-03 ENCOUNTER — Ambulatory Visit (HOSPITAL_COMMUNITY)
Admission: RE | Admit: 2015-05-03 | Discharge: 2015-05-03 | Disposition: A | Discharge status: Home / Self Care | Payer: Self-pay | Source: Ambulatory Visit | Attending: Family Medicine | Admitting: Family Medicine

## 2015-05-03 DIAGNOSIS — R1032 Left lower quadrant pain: Secondary | ICD-10-CM

## 2015-05-03 DIAGNOSIS — R1031 Right lower quadrant pain: Secondary | ICD-10-CM

## 2015-05-03 DIAGNOSIS — R102 Pelvic and perineal pain: Secondary | ICD-10-CM | POA: Insufficient documentation

## 2017-06-19 ENCOUNTER — Ambulatory Visit: Payer: Self-pay | Admitting: Family Medicine

## 2018-11-16 ENCOUNTER — Other Ambulatory Visit: Payer: Self-pay | Admitting: *Deleted

## 2018-11-16 DIAGNOSIS — Z124 Encounter for screening for malignant neoplasm of cervix: Secondary | ICD-10-CM

## 2018-11-17 NOTE — Progress Notes (Signed)
Patient: Maria Welch           Date of Birth: [birth year]           MRN: [id number] Visit Date: 11/16/2018 PCP: No primary care provider on file.  Cervical Cancer Screening Do you smoke?: No Have you ever had or been told you have an allergy to latex products?: No Marital status: Married Date of last pap smear: Within the last year Date of last menstrual period: 11/06/18 Number of pregnancies: 3 Number of births: 3 Have you ever had any of the following? Hysterectomy: No Tubal ligation (tubes tied): No Abnormal bleeding: Yes Abnormal pap smear: No Venereal warts: No A sex partner with venereal warts: No A high risk* sex partner: No  Cervical Exam Exam not completed.  Patient's History Patient Active Problem List   Diagnosis Date Noted  . Language barrier 04/19/2015   No past medical history on file.  Family History  Problem Relation Age of Onset  . Diabetes Mother   . Diabetes Father     Social History   Occupational History  . Not on file  Tobacco Use  . Smoking status: Never Smoker  Substance and Sexual Activity  . Alcohol use: Not on file  . Drug use: Not on file  . Sexual activity: Not on file   Patient examined by Terie Purser, PA-C. Visit notes scanned into Epic.

## 2018-11-20 LAB — CYTOLOGY - PAP: DIAGNOSIS: NEGATIVE

## 2018-12-28 ENCOUNTER — Encounter (HOSPITAL_COMMUNITY): Payer: Self-pay

## 2019-01-04 ENCOUNTER — Encounter (HOSPITAL_COMMUNITY): Payer: Self-pay

## 2019-01-04 NOTE — Progress Notes (Signed)
Mailed patient pap result letter. Normal pap.

## 2021-09-18 ENCOUNTER — Emergency Department (HOSPITAL_COMMUNITY): Payer: Self-pay

## 2021-09-18 ENCOUNTER — Emergency Department (HOSPITAL_COMMUNITY)
Admission: EM | Admit: 2021-09-18 | Discharge: 2021-09-18 | Disposition: A | Discharge status: Home / Self Care | Payer: Self-pay | Attending: Emergency Medicine | Admitting: Emergency Medicine

## 2021-09-18 ENCOUNTER — Other Ambulatory Visit: Payer: Self-pay

## 2021-09-18 DIAGNOSIS — N939 Abnormal uterine and vaginal bleeding, unspecified: Secondary | ICD-10-CM | POA: Insufficient documentation

## 2021-09-18 LAB — URINALYSIS, ROUTINE W REFLEX MICROSCOPIC
Bacteria, UA: NONE SEEN
Bilirubin Urine: NEGATIVE
Glucose, UA: NEGATIVE mg/dL
Ketones, ur: NEGATIVE mg/dL
Leukocytes,Ua: NEGATIVE
Nitrite: NEGATIVE
Protein, ur: NEGATIVE mg/dL
RBC / HPF: >50 RBC/hpf — ABNORMAL HIGH (ref 0–5)
Specific Gravity, Urine: 1.019 (ref 1.005–1.030)
pH: 5 (ref 5.0–8.0)

## 2021-09-18 LAB — BASIC METABOLIC PANEL
Anion gap: 10 (ref 5–15)
BUN: 9 mg/dL (ref 6–20)
CO2: 24 mmol/L (ref 22–32)
Calcium: 8.8 mg/dL — ABNORMAL LOW (ref 8.9–10.3)
Chloride: 104 mmol/L (ref 98–111)
Creatinine, Ser: 0.66 mg/dL (ref 0.44–1.00)
GFR, Estimated: >60 mL/min (ref >60)
Glucose, Bld: 124 mg/dL — ABNORMAL HIGH (ref 70–99)
Potassium: 3.7 mmol/L (ref 3.5–5.1)
Sodium: 138 mmol/L (ref 135–145)

## 2021-09-18 LAB — CBC
HCT: 39.9 % (ref 36.0–46.0)
Hemoglobin: 13.2 g/dL (ref 12.0–15.0)
MCH: 28.8 pg (ref 26.0–34.0)
MCHC: 33.1 g/dL (ref 30.0–36.0)
MCV: 87.1 fL (ref 80.0–100.0)
Platelets: 312 10*3/uL (ref 150–400)
RBC: 4.58 MIL/uL (ref 3.87–5.11)
RDW: 12.6 % (ref 11.5–15.5)
WBC: 6.8 10*3/uL (ref 4.0–10.5)
nRBC: 0 % (ref 0.0–0.2)

## 2021-09-18 LAB — I-STAT BETA HCG BLOOD, ED (MC, WL, AP ONLY): I-stat hCG, quantitative: <5 m[IU]/mL (ref <5)

## 2021-09-18 MED ORDER — NATAZIA 3/2-2/2-3/1 MG PO TABS: 1.0000 | ORAL_TABLET | Freq: Every day | ORAL | 0 refills | Status: AC

## 2021-09-18 NOTE — ED Triage Notes (Signed)
Pt reports vaginal bleeding with copious clots x 16 days. Having general malaise/weakness from same. Has IUD and was supposed to have it removed long ago.

## 2021-09-18 NOTE — ED Provider Notes (Signed)
[facility] EMERGENCY DEPARTMENT Provider Note   CSN: [id number] Arrival date & time: 09/18/21  1357     History Chief Complaint  Patient presents with   Vaginal Bleeding    Maria Welch is a 45 y.o. female.  HPI  45 year old female presents with vaginal bleeding.  She is primarily Spanish-speaking, preferring to use her daughter as Nurse, learning disability, refusing interpreter services.  States that she had an IUD placed many years ago.  Currently does not have any outpatient gynecological care.  Over the past 2 weeks has been having varying amount of vaginal bleeding with a couple days of large clots.  She feels fatigued but denies any chest pain, shortness of breath, dizziness.  She has not on any anticoagulation.  No past medical history on file.  Patient Active Problem List   Diagnosis Date Noted   Language barrier 04/19/2015    Past Surgical History:  Procedure Laterality Date   ANKLE FRACTURE SURGERY Left      OB History   No obstetric history on file.     Family History  Problem Relation Age of Onset   Diabetes Mother    Diabetes Father     Social History   Tobacco Use   Smoking status: Never    Home Medications Prior to Admission medications   Medication Sig Start Date End Date Taking? Authorizing Provider  Estradiol Valerate-Dienogest (NATAZIA) 3/2-2/2-3/1 MG tablet Take 1 tablet by mouth daily. 09/18/21  Yes Deija Buhrman M, DO  Alum & Mag Hydroxide-Simeth (MAGIC MOUTHWASH W/LIDOCAINE) SOLN Take 5 mLs by mouth 4 (four) times daily as needed. Patient not taking: Reported on 04/19/2015 12/23/11   Rickard Patience, PA-C  cyclobenzaprine (FLEXERIL) 10 MG tablet Take 1 tablet (10 mg total) by mouth 2 (two) times daily as needed for muscle spasms. 04/19/15   Copland, Gwenlyn Found, MD  fluconazole (DIFLUCAN) 150 MG tablet Take 1 tablet (150 mg total) by mouth once. 04/25/15   Copland, Gwenlyn Found, MD    Allergies    Patient has no known  allergies.  Review of Systems   Review of Systems  Constitutional:  Positive for fatigue. Negative for chills and fever.  HENT:  Negative for congestion.   Respiratory:  Negative for shortness of breath.   Cardiovascular:  Negative for chest pain.  Gastrointestinal:  Negative for vomiting.  Genitourinary:  Positive for pelvic pain and vaginal bleeding. Negative for dysuria, vaginal discharge and vaginal pain.  Skin:  Negative for rash.  Neurological:  Negative for dizziness, syncope, light-headedness and headaches.   Physical Exam Updated Vital Signs BP 111/74    Pulse 62    Temp 98.6 F (37 C)    Resp 16    LMP 09/02/2021 (Exact Date)    SpO2 99%   Physical Exam Vitals and nursing note reviewed.  Constitutional:      General: She is not in acute distress.    Appearance: Normal appearance.  HENT:     Head: Normocephalic.     Mouth/Throat:     Mouth: Mucous membranes are moist.  Cardiovascular:     Rate and Rhythm: Normal rate.  Pulmonary:     Effort: Pulmonary effort is normal. No respiratory distress.  Abdominal:     Palpations: Abdomen is soft.  Genitourinary:    Comments: Normal external anatomy, trace amount of dark red blood in the vaginal vault, unremarkable cervix, no IUD strings noted but there is blood at the cervical os, no CMT or  adnexal fullness/tenderness.  Chaperoned by tech TDedra Skeens Skin:    General: Skin is warm.  Neurological:     Mental Status: She is alert and oriented to person, place, and time. Mental status is at baseline.  Psychiatric:        Mood and Affect: Mood normal.    ED Results / Procedures / Treatments   Labs (all labs ordered are listed, but only abnormal results are displayed) Labs Reviewed  BASIC METABOLIC PANEL - Abnormal; Notable for the following components:      Result Value   Glucose, Bld 124 (*)    Calcium 8.8 (*)    All other components within normal limits  URINALYSIS, ROUTINE W REFLEX MICROSCOPIC - Abnormal; Notable for the  following components:   Hgb urine dipstick MODERATE (*)    RBC / HPF >50 (*)    All other components within normal limits  CBC  I-STAT BETA HCG BLOOD, ED ([facility], [facility], [facility] ONLY)    EKG None  Radiology US Transvaginal Non-OB  Result Date: 09/18/2021 CLINICAL DATA:  Vaginal bleeding. EXAM: TRANSABDOMINAL AND TRANSVAGINAL ULTRASOUND OF PELVIS DOPPLER ULTRASOUND OF OVARIES TECHNIQUE: Both transabdominal and transvaginal ultrasound examinations of the pelvis were performed. Transabdominal technique was performed for global imaging of the pelvis including uterus, ovaries, adnexal regions, and pelvic cul-de-sac. It was necessary to proceed with endovaginal exam following the transabdominal exam to visualize the uterus, endometrium, bilateral ovaries and bilateral adnexa. Color and duplex Doppler ultrasound was utilized to evaluate blood flow to the ovaries. COMPARISON:  None. FINDINGS: Uterus Measurements: 6.1 cm x 3.8 cm x 5.4 cm = volume: 64.35 mL. No fibroids or other mass visualized. Endometrium Thickness: 5.25 mm. An IUD is in place and in proper position. No focal abnormality visualized. Right ovary Measurements: 2.5 cm x 1.5 cm x 2.7 cm = volume: 5.18 mL. Normal appearance/no adnexal mass. Left ovary Measurements: 2.2 cm x 1.5 cm x 2.2 cm = volume: 3.95 mL. Normal appearance/no adnexal mass. Pulsed Doppler evaluation demonstrates normal low-resistance arterial and venous waveforms in both ovaries. Other: A trace amount of pelvic free fluid is seen. IMPRESSION: 1. Properly positioned IUD. 2. Trace amount of pelvic free fluid which is likely physiologic in nature. 3. Otherwise, unremarkable pelvic ultrasound. Electronically Signed   By: Aram Candela M.D.   On: 09/18/2021 20:13   US Pelvis Complete  Result Date: 09/18/2021 CLINICAL DATA:  Vaginal bleeding. EXAM: TRANSABDOMINAL ULTRASOUND OF PELVIS DOPPLER ULTRASOUND OF OVARIES TECHNIQUE: Transabdominal ultrasound examination of the pelvis was  performed including evaluation of the uterus, ovaries, adnexal regions, and pelvic cul-de-sac. Color and duplex Doppler ultrasound was utilized to evaluate blood flow to the ovaries. COMPARISON:  None. FINDINGS: Uterus Measurements: 6.1 cm x 3.8 cm x 5.4 cm = volume: 64.35 mL. No fibroids or other mass visualized. Endometrium Thickness: 5.25 mm. An IUD is in place and in proper position. No focal abnormality visualized. Right ovary Measurements: 2.5 cm x 1.5 cm x 2.7 cm = volume: 5.18 mL. Normal appearance/no adnexal mass. Left ovary Measurements: 2.2 cm x 1.5 cm x 2.2 cm = volume: 3.95 mL. Normal appearance/no adnexal mass. Pulsed Doppler evaluation demonstrates normal low-resistance arterial and venous waveforms in both ovaries. Other: A trace amount of pelvic free fluid is seen. IMPRESSION: 1. Properly positioned IUD. 2. Trace amount of pelvic free fluid which is likely physiologic in nature. 3. Otherwise, unremarkable pelvic ultrasound. Electronically Signed   By: Aram Candela M.D.   On: 09/18/2021 20:11  [place] Art/Ven Flow Abd Pelv Doppler  Result Date: 09/18/2021 CLINICAL DATA:  Vaginal bleeding. EXAM: TRANSABDOMINAL AND TRANSVAGINAL ULTRASOUND OF PELVIS DOPPLER ULTRASOUND OF OVARIES TECHNIQUE: Both transabdominal and transvaginal ultrasound examinations of the pelvis were performed. Transabdominal technique was performed for global imaging of the pelvis including uterus, ovaries, adnexal regions, and pelvic cul-de-sac. It was necessary to proceed with endovaginal exam following the transabdominal exam to visualize the uterus, endometrium, bilateral ovaries and bilateral adnexa. Color and duplex Doppler ultrasound was utilized to evaluate blood flow to the ovaries. COMPARISON:  None. FINDINGS: Uterus Measurements: 6.1 cm x 3.8 cm x 5.4 cm = volume: 64.35 mL. No fibroids or other mass visualized. Endometrium Thickness: 5.25 mm. An IUD is in place and in proper position. No focal abnormality visualized.  Right ovary Measurements: 2.5 cm x 1.5 cm x 2.7 cm = volume: 5.18 mL. Normal appearance/no adnexal mass. Left ovary Measurements: 2.2 cm x 1.5 cm x 2.2 cm = volume: 3.95 mL. Normal appearance/no adnexal mass. Pulsed Doppler evaluation demonstrates normal low-resistance arterial and venous waveforms in both ovaries. Other: A trace amount of pelvic free fluid is seen. IMPRESSION: 1. Properly positioned IUD. 2. Trace amount of pelvic free fluid which is likely physiologic in nature. 3. Otherwise, unremarkable pelvic ultrasound. Electronically Signed   By: Aram Candela M.D.   On: 09/18/2021 20:12    Procedures Procedures   Medications Ordered in ED Medications - No data to display  ED Course  I have reviewed the triage vital signs and the nursing notes.  Pertinent labs & imaging results that were available during my care of the patient were reviewed by me and considered in my medical decision making (see chart for details).    MDM Rules/Calculators/A&P                          45 year old female presents emergency department with ongoing vaginal bleeding.  IUD in place but "for many years".  Has not had any outpatient OB/GYN follow-up recently.  Now with varying degree of vaginal bleeding for the past 2 weeks.  No symptoms of acute anemia.  Vitals are stable on arrival.  Blood work is normal.  Not on any anticoagulation.  Chaperoned pelvic shows small amount of bleeding in the vaginal vault, no other acute abnormality.  Ultrasound shows in-place IUD without any other abnormal finding.  Plan to place the patient on hormonal therapy for vaginal bleeding control and refer for outpatient OB/GYN follow-up.  Importance of this was stressed for reevaluation of IUD and most likely removal.  Patient at this time appears safe and stable for discharge and will be treated as an outpatient.  Discharge plan and strict return to ED precautions discussed, patient verbalizes understanding and  agreement.     Final Clinical Impression(s) / ED Diagnoses Final diagnoses:  Vaginal bleeding    Rx / DC Orders ED Discharge Orders          Ordered    Estradiol Valerate-Dienogest (NATAZIA) 3/2-2/2-3/1 MG tablet  Daily        09/18/21 2258             Rozelle Logan, DO 09/18/21 2313

## 2021-09-18 NOTE — ED Provider Notes (Signed)
Emergency Medicine Provider Triage Evaluation Note  Maria Welch , a 45 y.o. female  was evaluated in triage.  Pt complains of 16 days of vaginal bleeding with passage of large clots and more irregular bleeding last few days.  History of IUD in place that has expired more than a year ago.  Intermittent lower abdominal cramping such as with her period.  Review of Systems  Positive: Vaginal bleeding, pelvic pain Negative: Nausea, vomiting, diarrhea  Physical Exam  BP 111/80 (BP Location: Right Arm)    Pulse 70    Temp 98.5 F (36.9 C) (Oral)    Resp 14    LMP 09/02/2021 (Exact Date)    SpO2 99%  Gen:   Awake, no distress   Resp:  Normal effort  MSK:   Moves extremities without difficulty  Other:  Tenderness palpation of the lower abdomen bilaterally without rebound or guarding.  No CVAT  Medical Decision Making  Medically screening exam initiated at 6:28 PM.  Appropriate orders placed.  Maria Welch was informed that the remainder of the evaluation will be completed by another provider, this initial triage assessment does not replace that evaluation, and the importance of remaining in the ED until their evaluation is complete.  This chart was dictated using voice recognition software, Dragon. Despite the best efforts of this provider to proofread and correct errors, errors may still occur which can change documentation meaning.    Maria Lore, PA-C 09/18/21 1852    Maria Logan, DO 09/18/21 2352

## 2021-09-18 NOTE — Discharge Instructions (Signed)
You have been seen and discharged from the emergency department.  Your blood work was normal, urine was normal.  Ultrasound showed that the IUD is in the appropriate place.  Establish care with gynecology for removal of IUD, further care.  Take new prescription as directed to help gain control of vaginal bleeding.  Follow-up with your primary provider for reevaluation and further care. Take home medications as prescribed. If you have any worsening symptoms or further concerns for your health please return to an emergency department for further evaluation.
